# Patient Record
Sex: Female | Born: 1970 | Race: Black or African American | Hispanic: No
Health system: Southern US, Community
[De-identification: ages and names within clinical notes are randomized; demographics above are authoritative.]

## PROBLEM LIST (undated history)

## (undated) DIAGNOSIS — D219 Benign neoplasm of connective and other soft tissue, unspecified: Secondary | ICD-10-CM

---

## 2019-06-30 ENCOUNTER — Other Ambulatory Visit: Payer: Self-pay | Admitting: Obstetrics & Gynecology

## 2019-06-30 DIAGNOSIS — M5416 Radiculopathy, lumbar region: Secondary | ICD-10-CM

## 2019-07-23 ENCOUNTER — Ambulatory Visit
Admission: RE | Admit: 2019-07-23 | Discharge: 2019-07-23 | Disposition: A | Discharge status: Home / Self Care | Payer: BC Managed Care – PPO | Source: Ambulatory Visit | Attending: Obstetrics & Gynecology | Admitting: Obstetrics & Gynecology

## 2019-07-23 ENCOUNTER — Other Ambulatory Visit: Payer: Self-pay

## 2019-07-23 DIAGNOSIS — M5416 Radiculopathy, lumbar region: Secondary | ICD-10-CM

## 2019-07-23 IMAGING — MR MR LUMBAR SPINE W/O CM
4 of 5 series · 24 of 48 positions shown · non-contrast
Comparison: No pertinent prior studies available for comparison.

CLINICAL DATA: Radiculopathy of lumbar region. Additional history
provided: Status post fall [DATE], low back pain and bilateral
leg weakness, mostly on the left, pain extends down both legs.

EXAM:
MRI LUMBAR SPINE WITHOUT CONTRAST
TECHNIQUE: Multiplanar, multisequence MR imaging of the lumbar spine was
performed. No intravenous contrast was administered.

[Series 2: T2 · sagittal · 4.0mm · 0.55mm/px · 6 of 17 slices shown (1 of 2)]
[im 1/17]
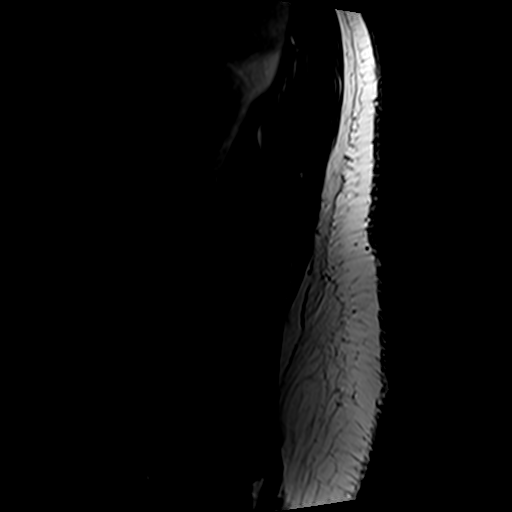
[im 4/17]
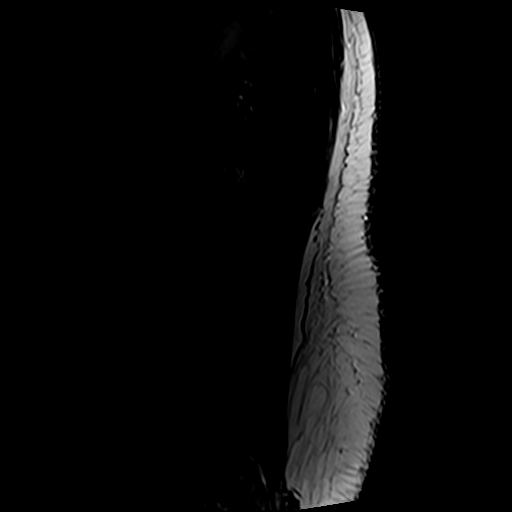
[im 7/17]
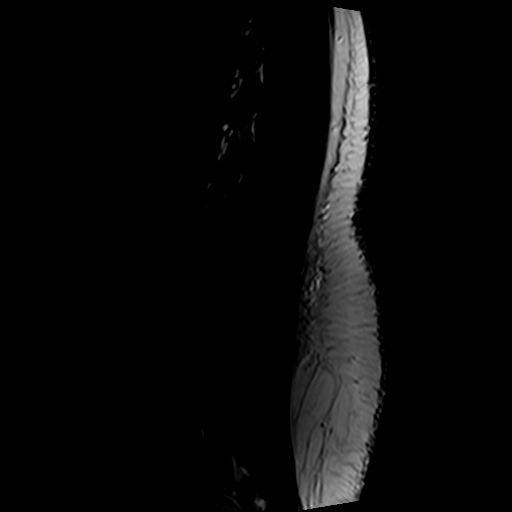
[im 10/17]
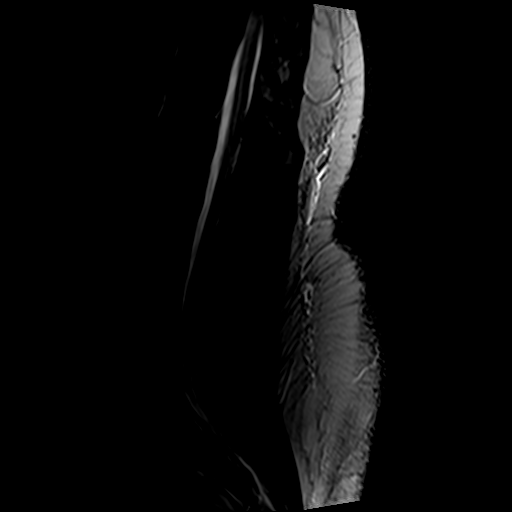
[im 13/17]
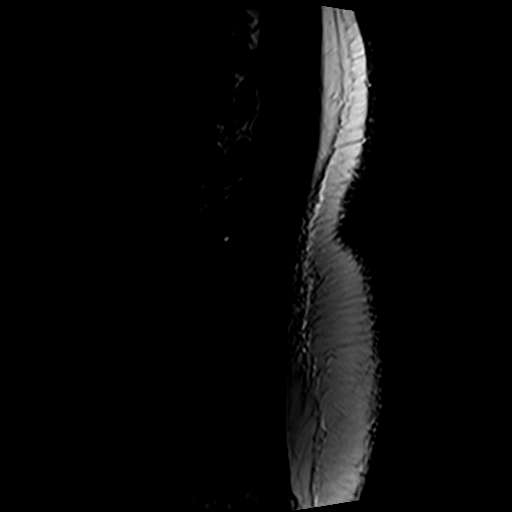
[im 17/17]
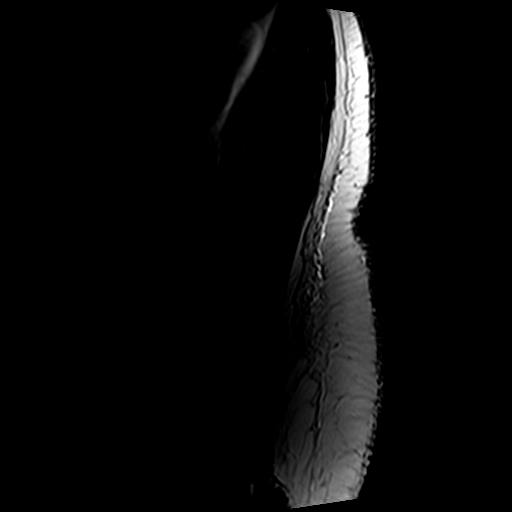

[Series 4: T1 · sagittal · 4.0mm · 0.55mm/px · 6 of 17 slices shown (1 of 2)]
[im 1/17]
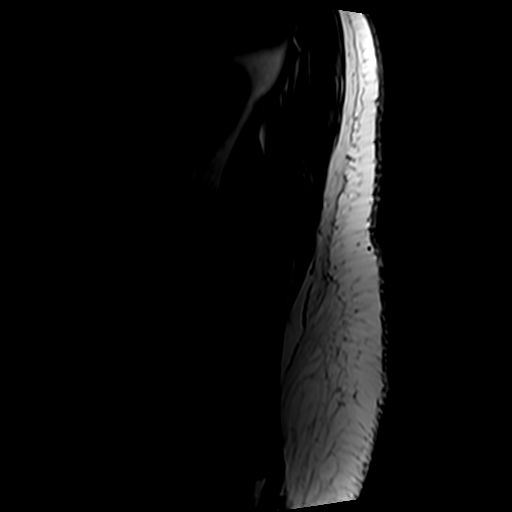
[im 4/17]
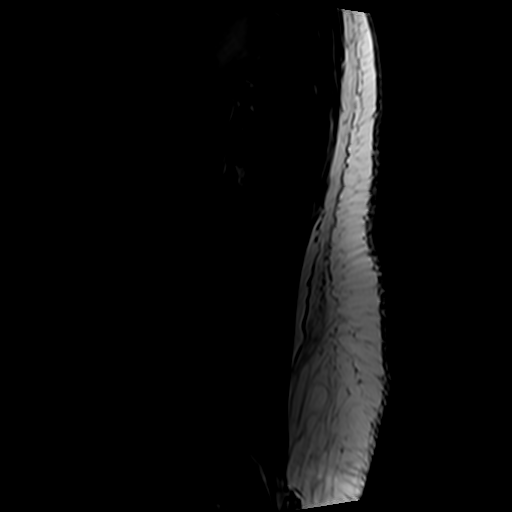
[im 7/17]
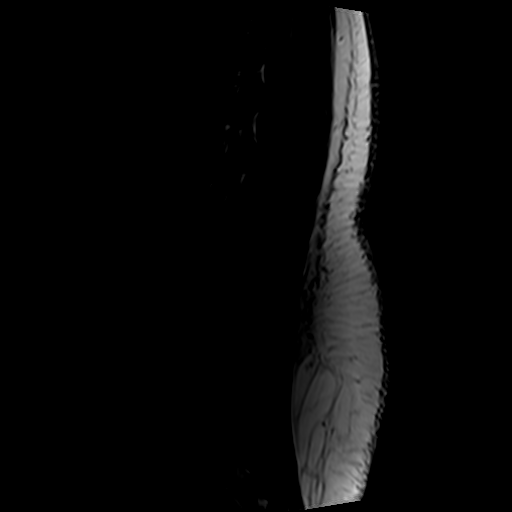
[im 10/17]
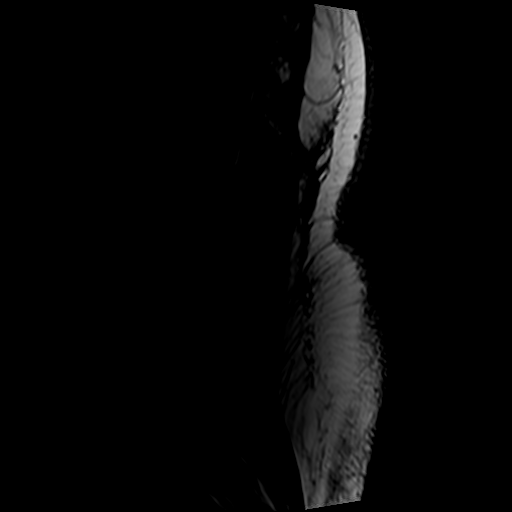
[im 13/17]
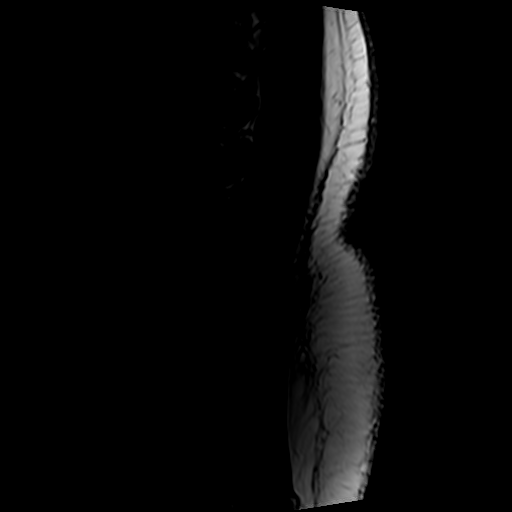
[im 17/17]
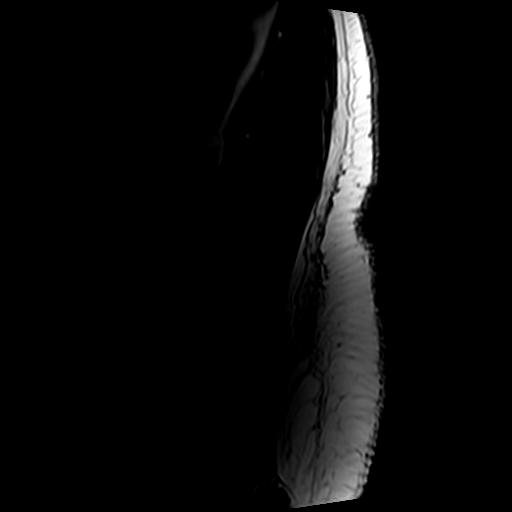

[Series 5: T2 · axial · 4.0mm · 0.70mm/px · z∈[-127,+114]mm · 9 of 45 slices shown (2 of 2)]
[im 1/45]
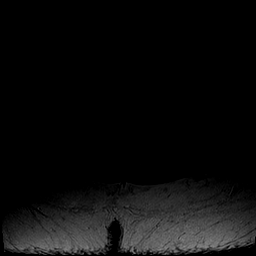
[im 7/45]
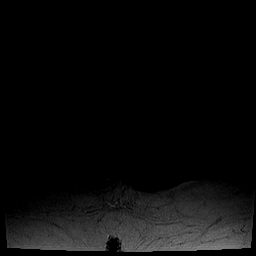
[im 13/45]
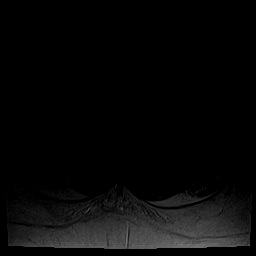
[im 19/45]
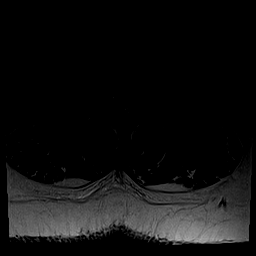
[im 23/45]
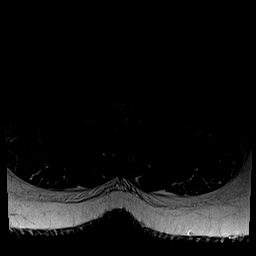
[im 26/45]
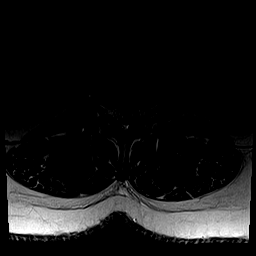
[im 32/45]
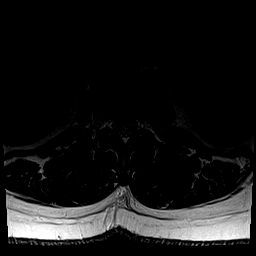
[im 38/45]
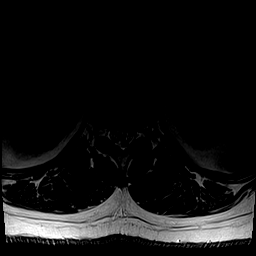
[im 45/45]
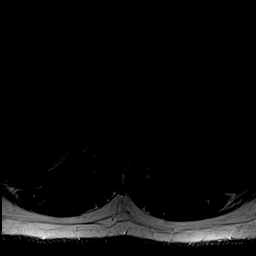

[Series 6: T1 · axial · 4.0mm · 0.35mm/px · z∈[-96,+77]mm · 3 of 45 slices shown (2 of 2)]
[im 7/45]
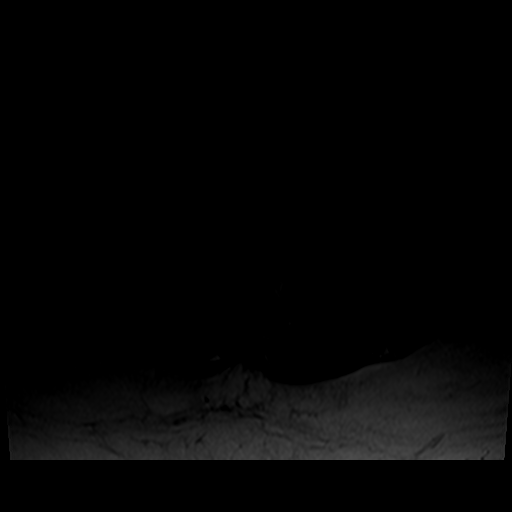
[im 23/45]
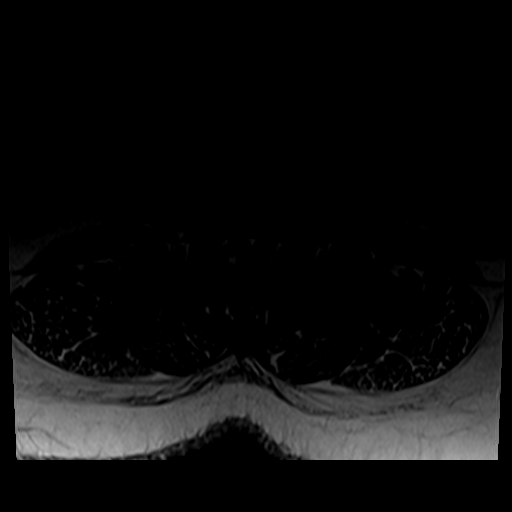
[im 38/45]
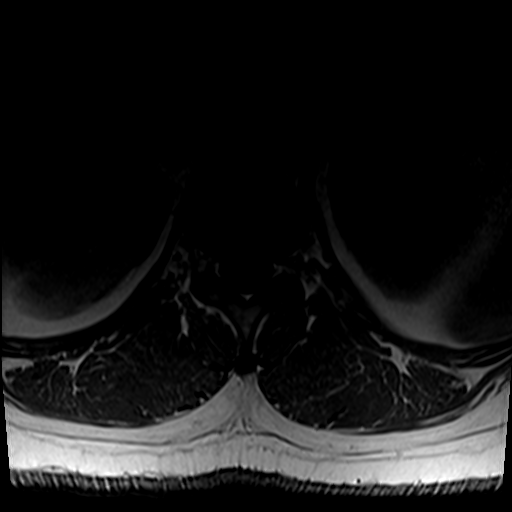

[24 of 48 positions shown; findings below may reference images not displayed]

FINDINGS: Segmentation: For the purposes of this dictation, five lumbar
vertebrae are assumed and the caudal most well-formed intervertebral
disc is designated L5-S1.

Alignment:  No significant spondylolisthesis.

Vertebrae: Vertebral body height is maintained. No marrow edema or
suspicious osseous lesion

Conus medullaris and cauda equina: Conus extends to the L1-L2 level.
No signal abnormality within the visualized distal spinal cord.

Paraspinal and other soft tissues: No abnormality identified within
included portions of the abdomen/retroperitoneum. Paraspinal soft
tissues within normal limits.

Disc levels:

Mild L4-L5 disc degeneration.

T11-T12: Shallow disc bulge. No significant spinal canal or
foraminal stenosis.

T12-L1: No disc herniation. No significant canal or foraminal
stenosis.

L1-L2: No disc herniation. No significant canal or foraminal
stenosis.

L2-L3: Mild facet arthrosis/ligamentum flavum hypertrophy. No disc
herniation. No significant canal or foraminal stenosis.

L3-L4: Disc bulge. Superimposed shallow left subarticular/foraminal
disc protrusion. Mild facet arthrosis/ligamentum flavum hypertrophy.
Mild left greater than right subarticular narrowing without frank
nerve root impingement. Central canal patent. Minimal left neural
foraminal narrowing.

L4-L5: Disc bulge. Superimposed broad-based left center to left
foraminal disc protrusion. Mild facet arthrosis with moderate
ligamentum flavum hypertrophy. Moderate central canal stenosis.
Bilateral subarticular stenosis (severe on the left) with crowding
of left-sided descending nerve roots, particularly the descending
left L5 nerve root. Moderate left neural foraminal narrowing.

L5-S1: Moderate facet arthrosis/ligamentum flavum hypertrophy. Mild
left subarticular narrowing without frank nerve root impingement.
Central canal patent. Mild bilateral neural foraminal narrowing.
IMPRESSION: Lumbar spondylosis as outlined and most notably as follows.

At L4-L5, disc bulge with superimposed broad-based left center to
left foraminal disc protrusion. Facet arthrosis/ligamentum flavum
hypertrophy. Moderate central canal stenosis. Bilateral subarticular
stenosis (severe on the left) with encroachment upon descending
left-sided nerve roots, particularly the descending left L5 nerve
root. Moderate left neural foraminal narrowing.

No more than mild spinal canal or neural foraminal narrowing at the
remaining levels.

## 2019-07-26 ENCOUNTER — Other Ambulatory Visit: Payer: Self-pay | Admitting: Obstetrics and Gynecology

## 2019-07-26 DIAGNOSIS — D259 Leiomyoma of uterus, unspecified: Secondary | ICD-10-CM

## 2019-08-19 ENCOUNTER — Other Ambulatory Visit: Payer: Self-pay | Admitting: Obstetrics and Gynecology

## 2019-08-19 ENCOUNTER — Inpatient Hospital Stay: Admission: RE | Admit: 2019-08-19 | Payer: BC Managed Care – PPO | Source: Ambulatory Visit

## 2019-08-19 DIAGNOSIS — D259 Leiomyoma of uterus, unspecified: Secondary | ICD-10-CM

## 2019-08-28 ENCOUNTER — Emergency Department (HOSPITAL_COMMUNITY): Payer: BC Managed Care – PPO

## 2019-08-28 ENCOUNTER — Other Ambulatory Visit: Payer: Self-pay

## 2019-08-28 ENCOUNTER — Emergency Department (HOSPITAL_COMMUNITY)
Admission: EM | Admit: 2019-08-28 | Discharge: 2019-08-28 | Disposition: A | Discharge status: Home / Self Care | Payer: BC Managed Care – PPO | Attending: Emergency Medicine | Admitting: Emergency Medicine

## 2019-08-28 ENCOUNTER — Encounter (HOSPITAL_COMMUNITY): Payer: Self-pay

## 2019-08-28 DIAGNOSIS — R102 Pelvic and perineal pain: Secondary | ICD-10-CM | POA: Insufficient documentation

## 2019-08-28 DIAGNOSIS — D219 Benign neoplasm of connective and other soft tissue, unspecified: Secondary | ICD-10-CM | POA: Diagnosis not present

## 2019-08-28 DIAGNOSIS — N939 Abnormal uterine and vaginal bleeding, unspecified: Secondary | ICD-10-CM | POA: Diagnosis not present

## 2019-08-28 DIAGNOSIS — D649 Anemia, unspecified: Secondary | ICD-10-CM | POA: Insufficient documentation

## 2019-08-28 HISTORY — DX: Benign neoplasm of connective and other soft tissue, unspecified: D21.9

## 2019-08-28 LAB — CBC WITH DIFFERENTIAL/PLATELET
Abs Immature Granulocytes: 0.02 10*3/uL (ref 0.00–0.07)
Basophils Absolute: 0 10*3/uL (ref 0.0–0.1)
Basophils Relative: 0 %
Eosinophils Absolute: 0 10*3/uL (ref 0.0–0.5)
Eosinophils Relative: 1 %
HCT: 29.7 % — ABNORMAL LOW (ref 36.0–46.0)
Hemoglobin: 8.3 g/dL — ABNORMAL LOW (ref 12.0–15.0)
Immature Granulocytes: 0 %
Lymphocytes Relative: 25 %
Lymphs Abs: 1.3 10*3/uL (ref 0.7–4.0)
MCH: 17.4 pg — ABNORMAL LOW (ref 26.0–34.0)
MCHC: 27.9 g/dL — ABNORMAL LOW (ref 30.0–36.0)
MCV: 62.1 fL — ABNORMAL LOW (ref 80.0–100.0)
Monocytes Absolute: 0.5 10*3/uL (ref 0.1–1.0)
Monocytes Relative: 9 %
Neutro Abs: 3.3 10*3/uL (ref 1.7–7.7)
Neutrophils Relative %: 65 %
Platelets: 311 10*3/uL (ref 150–400)
RBC: 4.78 MIL/uL (ref 3.87–5.11)
RDW: 20 % — ABNORMAL HIGH (ref 11.5–15.5)
WBC: 5.2 10*3/uL (ref 4.0–10.5)
nRBC: 0 % (ref 0.0–0.2)

## 2019-08-28 LAB — URINALYSIS, ROUTINE W REFLEX MICROSCOPIC
Bacteria, UA: NONE SEEN
Bilirubin Urine: NEGATIVE
Glucose, UA: >=500 mg/dL — AB
Ketones, ur: 5 mg/dL — AB
Leukocytes,Ua: NEGATIVE
Nitrite: NEGATIVE
Protein, ur: NEGATIVE mg/dL
RBC / HPF: >50 RBC/hpf — ABNORMAL HIGH (ref 0–5)
Specific Gravity, Urine: 1.031 — ABNORMAL HIGH (ref 1.005–1.030)
pH: 6 (ref 5.0–8.0)

## 2019-08-28 LAB — COMPREHENSIVE METABOLIC PANEL
ALT: 22 U/L (ref 0–44)
AST: 19 U/L (ref 15–41)
Albumin: 3.4 g/dL — ABNORMAL LOW (ref 3.5–5.0)
Alkaline Phosphatase: 75 U/L (ref 38–126)
Anion gap: 5 (ref 5–15)
BUN: 9 mg/dL (ref 6–20)
CO2: 28 mmol/L (ref 22–32)
Calcium: 8.9 mg/dL (ref 8.9–10.3)
Chloride: 108 mmol/L (ref 98–111)
Creatinine, Ser: 0.62 mg/dL (ref 0.44–1.00)
GFR calc Af Amer: >60 mL/min (ref >60)
GFR calc non Af Amer: >60 mL/min (ref >60)
Glucose, Bld: 258 mg/dL — ABNORMAL HIGH (ref 70–99)
Potassium: 4 mmol/L (ref 3.5–5.1)
Sodium: 141 mmol/L (ref 135–145)
Total Bilirubin: 0.5 mg/dL (ref 0.3–1.2)
Total Protein: 6.9 g/dL (ref 6.5–8.1)

## 2019-08-28 LAB — WET PREP, GENITAL
Clue Cells Wet Prep HPF POC: NONE SEEN
Sperm: NONE SEEN
Trich, Wet Prep: NONE SEEN
Yeast Wet Prep HPF POC: NONE SEEN

## 2019-08-28 LAB — PREGNANCY, URINE: Preg Test, Ur: NEGATIVE

## 2019-08-28 IMAGING — US US PELVIS COMPLETE TRANSABD/TRANSVAG W DUPLEX
1 series · 13 of 25 positions shown · non-contrast
Comparison: None available.

CLINICAL DATA: Initial evaluation for acute pelvic pain for 6
months, heavy vaginal bleeding.

EXAM:
TRANSABDOMINAL AND TRANSVAGINAL ULTRASOUND OF PELVIS
DOPPLER ULTRASOUND OF OVARIES
TECHNIQUE: Both transabdominal and transvaginal ultrasound examinations of the
pelvis were performed. Transabdominal technique was performed for
global imaging of the pelvis including uterus, ovaries, adnexal
regions, and pelvic cul-de-sac.
It was necessary to proceed with endovaginal exam following the
transabdominal exam to visualize the uterus, endometrium, and
ovaries. Color and duplex Doppler ultrasound was utilized to
evaluate blood flow to the ovaries.

[Series 1: us pelvis complete transabd/transvag w duplex · 13 of 101 slices shown]
[im 1/101]
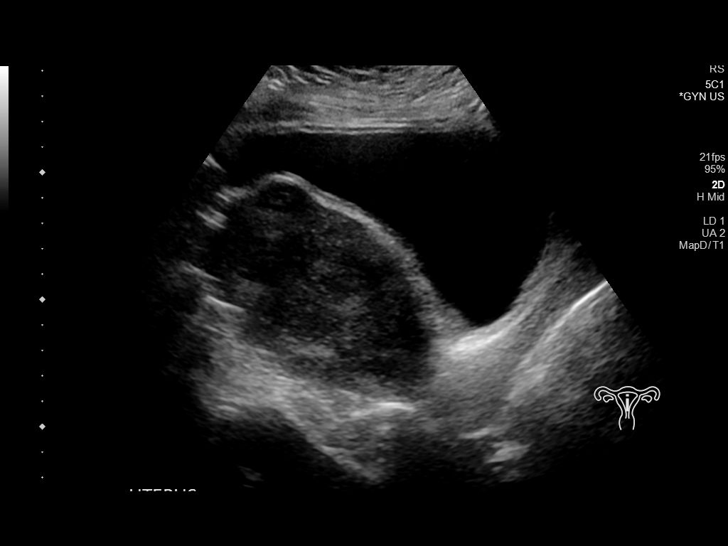
[im 9/101]
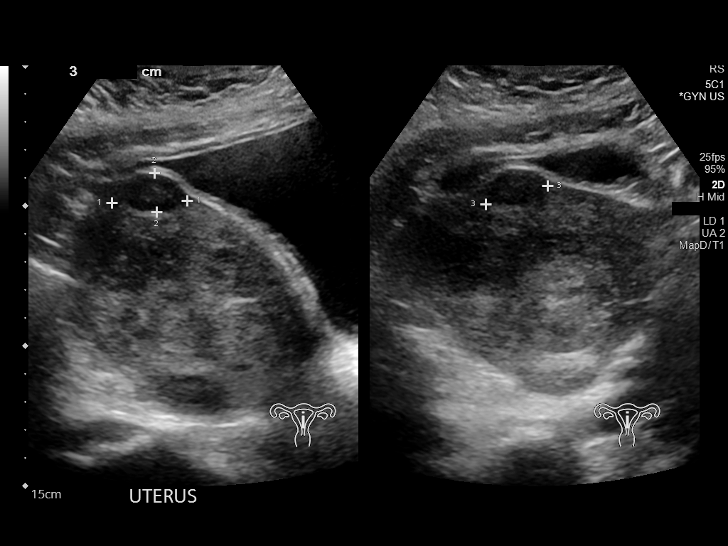
[im 17/101]
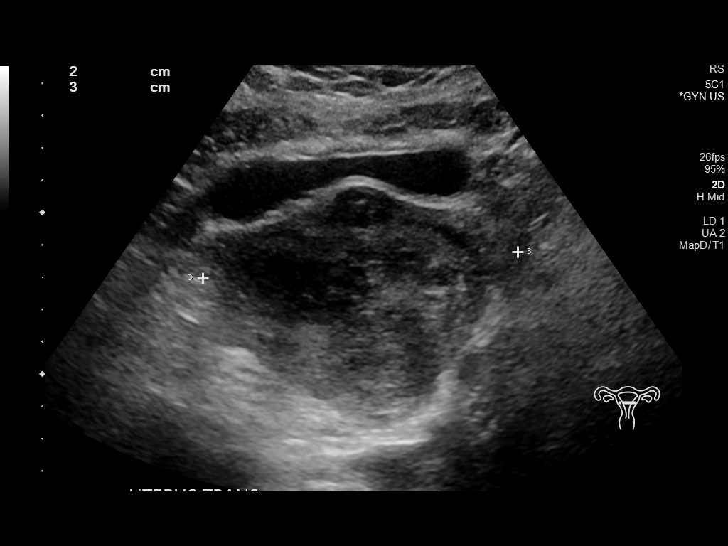
[im 26/101]
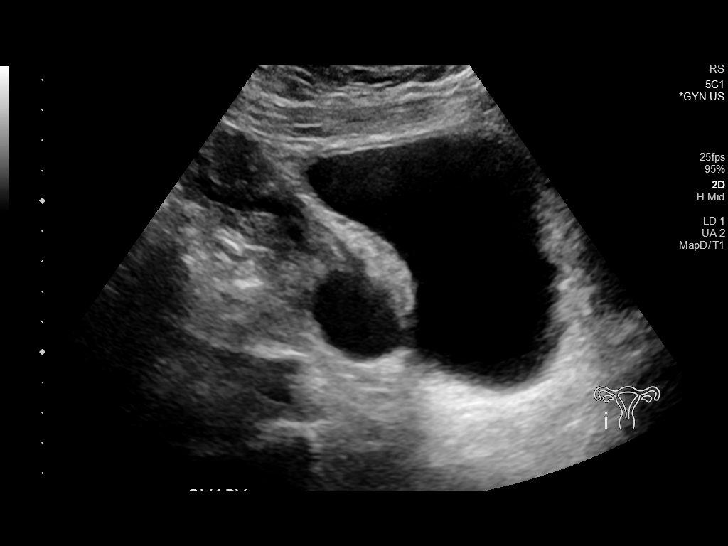
[im 34/101]
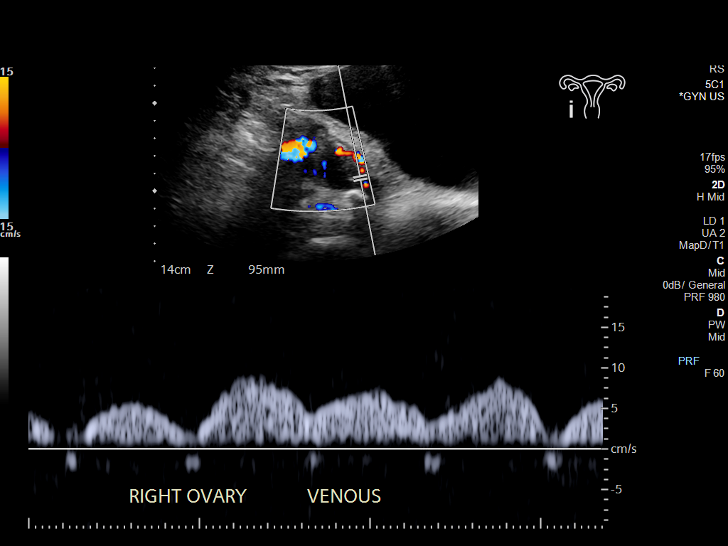
[im 42/101]
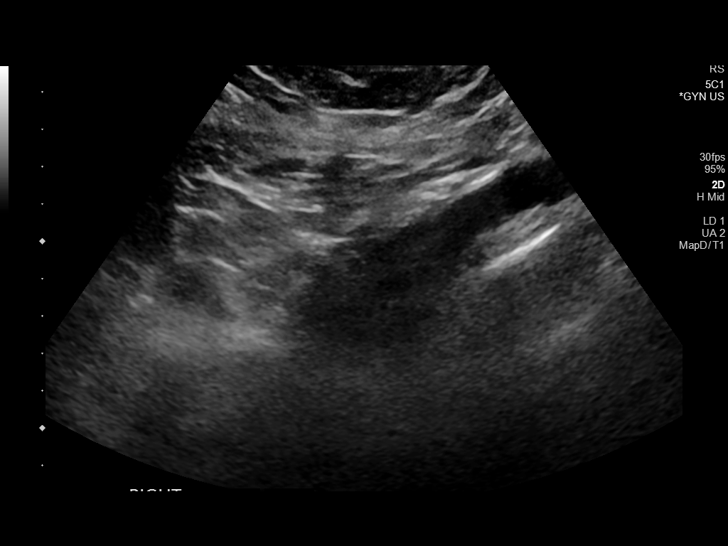
[im 51/101]
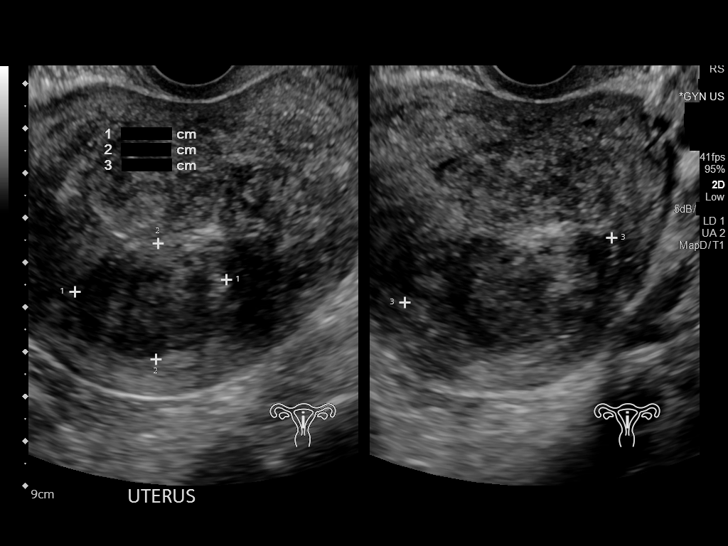
[im 59/101]
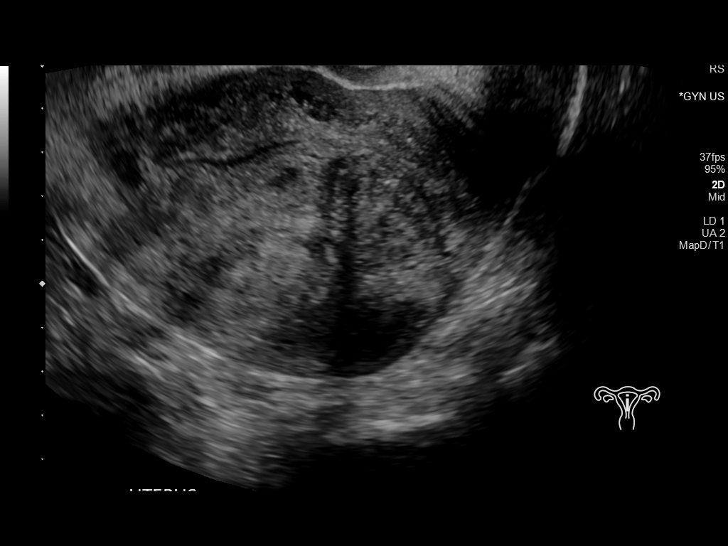
[im 67/101]
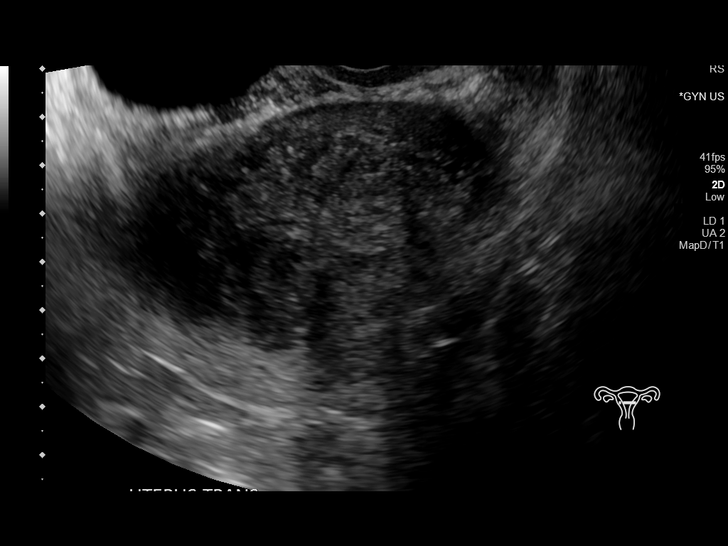
[im 76/101]
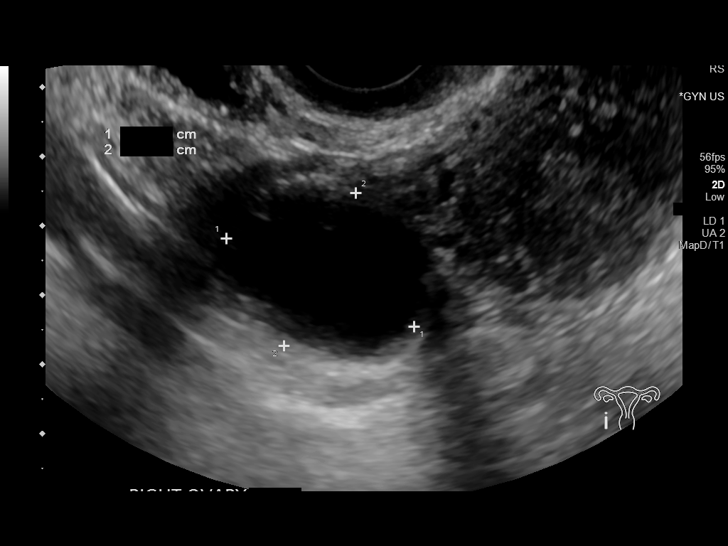
[im 84/101]
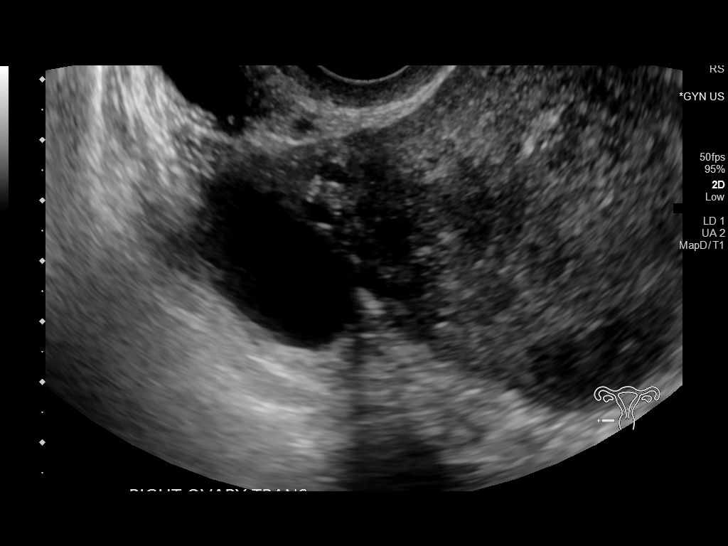
[im 92/101]
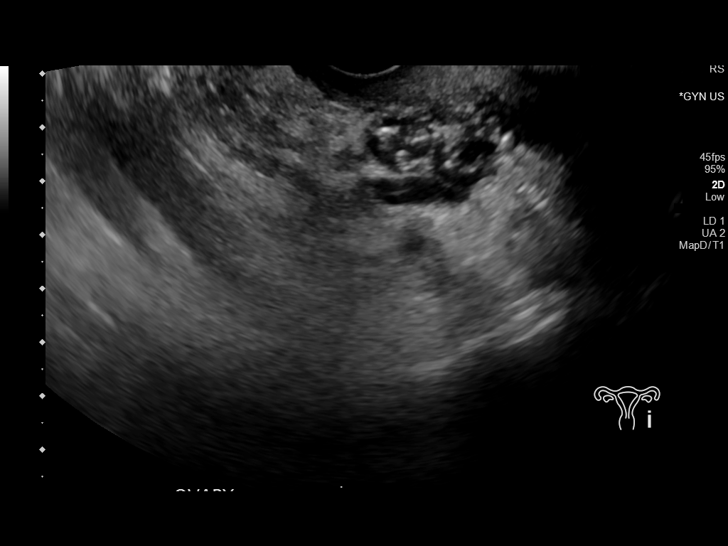
[im 101/101]
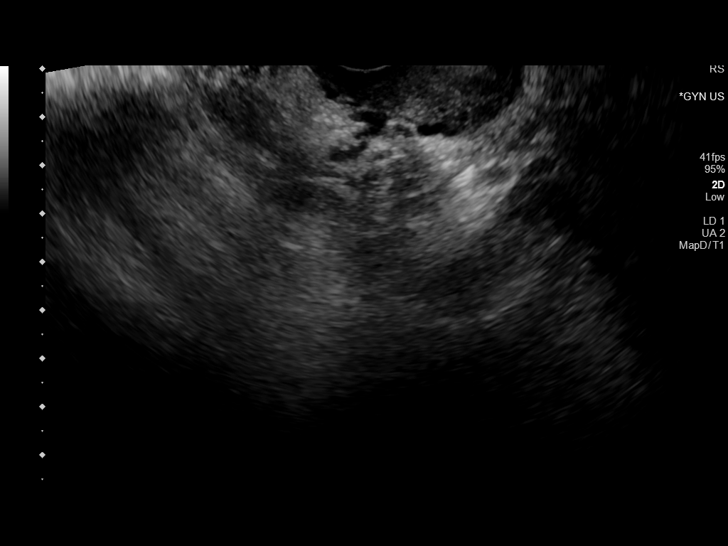

[13 of 25 positions shown; findings below may reference images not displayed]

FINDINGS: Uterus

Measurements: 14.0 x 6.9 x 9.8 cm = volume: 493.3 mL. 2.9 x 3.0 x
3.2 cm intramural fibroid present at the right posterior uterine
fundus. 2.7 x 1.4 x 2.3 cm subserosal fibroid present at the central
uterine fundus. 4.9 x 3.4 x 2.6 cm intramural fibroid present at the
right posterior uterine body/fundus.

Endometrium

Not well visualized due to overlying fibroids.

Right ovary

Measurements: 4.4 x 3.5 x 3.3 cm = volume: 26.1 mL. 3.0 x 2.4 x
cm simple cyst, most consistent with a normal physiologic follicular
cyst.

Left ovary

Not visualized.  No adnexal mass.

Pulsed Doppler evaluation of the right ovary demonstrates normal
low-resistance arterial and venous waveforms.

Other findings

No abnormal free fluid.
IMPRESSION: 1. Enlarged fibroid uterus as above.
2. 3 cm simple right ovarian cyst, most consistent with a normal
physiologic follicular cyst. This has benign characteristics and is
a common finding in premenopausal females. No imaging follow up is
required. No evidence for right ovarian torsion.
3. Nonvisualization of the left ovary.  No left adnexal mass.
4. Poor visualization of the endometrial complex due to overlying
fibroids. If there is clinical concern for an underlying endometrial
pathology, further assessment with dedicated sonohysterography
and/or pelvic MRI could be performed for further evaluation as
warranted.

## 2019-08-28 MED ORDER — SODIUM CHLORIDE 0.9 % IV BOLUS
1000.0000 mL | Freq: Once | INTRAVENOUS | Status: AC
  Administered 2019-08-28: 1000 mL via INTRAVENOUS

## 2019-08-28 NOTE — ED Provider Notes (Signed)
[facility] DEPT Provider Note   CSN: [id number] Arrival date & time: 08/28/19  1328    History Chief Complaint  Patient presents with  . Vaginal Bleeding    Melinda Wade is a 49 y.o. female with a history of fibroids, prior endometrial ablation, and T2DM who presents to the ED with complaints of vaginal bleeding x 6 days. Patient states she has a longstanding history of irregular vaginal bleeding related to her fibroids. She last stopped bleeding 05/30/19 (after about 3 months of bleeding) then subsequently restarted this past Tuesday. She states bleeding has been intermittently heavy, she became concerned when she started passing large clots, clot passage has never been this large before. Reports intermittent pelvic cramping which she has had previously with bleeding. She has had some a few episodes of intermittent lightheadedness/dizziness with quick position changes, otherwise none, no sycnope, as well as sensation of elevated HR. Given she was soaking through a pad within 1.5 hours she came to the ED per discussion with her OBGYN. She denies syncope, chest pain, dyspnea, fever, chills, or dysuria.   She is currently followed by GYN Dr. Jerl Santos with [facility]- plan for pelvic MRI 04/14 for further evaluation of fibroids and referral to interventional radiology @ last office visit 07/14/19. She would like minimally invasive surgical intervention, trying to avoid hysterectomy. Prior endometrial ablation helped with her sxs. She has required blood transfusions in the past. Currently has IUD in place.   HPI     Past Medical History:  Diagnosis Date  . Fibroids     There are no problems to display for this patient.   History reviewed. No pertinent surgical history.   OB History   No obstetric history on file.     No family history on file.  Social History   Tobacco Use  . Smoking status: Not on file  Substance Use Topics  . Alcohol use:  Not on file  . Drug use: Not on file    Home Medications Prior to Admission medications   Not on File    Allergies    Patient has no known allergies.  Review of Systems   Review of Systems  Constitutional: Negative for chills and fever.  Respiratory: Negative for shortness of breath.   Cardiovascular: Positive for palpitations. Negative for chest pain.  Gastrointestinal: Negative for diarrhea, nausea and vomiting.  Genitourinary: Positive for pelvic pain and vaginal bleeding. Negative for dysuria and vaginal discharge.  Neurological: Positive for dizziness and light-headedness. Negative for syncope.  All other systems reviewed and are negative.   Physical Exam Updated Vital Signs BP (!) 156/74 (BP Location: Right Arm)   Pulse 94   Temp 98 F (36.7 C) (Oral)   Resp 17   LMP  (LMP Unknown)   SpO2 92%   Physical Exam Vitals and nursing note reviewed. Exam conducted with a chaperone present.  Constitutional:      General: She is not in acute distress.    Appearance: She is well-developed. She is not toxic-appearing.  HENT:     Head: Normocephalic and atraumatic.  Eyes:     General:        Right eye: No discharge.        Left eye: No discharge.     Comments: Mild conjunctival pallor noted.   Cardiovascular:     Rate and Rhythm: Normal rate and regular rhythm.  Pulmonary:     Effort: Pulmonary effort is normal. No respiratory distress.  Breath sounds: Normal breath sounds. No wheezing, rhonchi or rales.  Abdominal:     General: There is no distension.     Palpations: Abdomen is soft.     Tenderness: There is abdominal tenderness (suprapubic). There is no guarding or rebound.  Genitourinary:    Vagina: Bleeding present.     Cervix: Cervical bleeding present.     Comments: Diffuse discomfort throughout exam.  No significant discharge noted. Mild blood present, no significant clots present.  Musculoskeletal:     Cervical back: Neck supple.  Skin:    General:  Skin is warm and dry.     Findings: No rash.  Neurological:     Mental Status: She is alert.     Comments: Clear speech.   Psychiatric:        Behavior: Behavior normal.     ED Results / Procedures / Treatments   Labs (all labs ordered are listed, but only abnormal results are displayed) Labs Reviewed  WET PREP, GENITAL - Abnormal; Notable for the following components:      Result Value   WBC, Wet Prep HPF POC FEW (*)    All other components within normal limits  COMPREHENSIVE METABOLIC PANEL - Abnormal; Notable for the following components:   Glucose, Bld 258 (*)    Albumin 3.4 (*)    All other components within normal limits  CBC WITH DIFFERENTIAL/PLATELET - Abnormal; Notable for the following components:   Hemoglobin 8.3 (*)    HCT 29.7 (*)    MCV 62.1 (*)    MCH 17.4 (*)    MCHC 27.9 (*)    RDW 20.0 (*)    All other components within normal limits  URINALYSIS, ROUTINE W REFLEX MICROSCOPIC - Abnormal; Notable for the following components:   Specific Gravity, Urine 1.031 (*)    Glucose, UA >=500 (*)    Hgb urine dipstick LARGE (*)    Ketones, ur 5 (*)    RBC / HPF >50 (*)    All other components within normal limits  PREGNANCY, URINE  GC/CHLAMYDIA PROBE AMP ([place]) NOT AT [facility]    EKG EKG Interpretation  Date/Time:  Sunday August 28 2019 16:40:03 EDT Ventricular Rate:  86 PR Interval:    QRS Duration: 105 QT Interval:  381 QTC Calculation: 456 R Axis:   39 Text Interpretation: Sinus rhythm Low voltage, precordial leads Abnormal R-wave progression, early transition No previous ECGs available Confirmed by Wandra Arthurs [phone]) on 08/28/2019 4:55:40 PM   Radiology US PELVIC COMPLETE W TRANSVAGINAL AND TORSION R/O  Result Date: 08/28/2019 CLINICAL DATA:  Initial evaluation for acute pelvic pain for 6 months, heavy vaginal bleeding. EXAM: TRANSABDOMINAL AND TRANSVAGINAL ULTRASOUND OF PELVIS DOPPLER ULTRASOUND OF OVARIES TECHNIQUE: Both transabdominal and  transvaginal ultrasound examinations of the pelvis were performed. Transabdominal technique was performed for global imaging of the pelvis including uterus, ovaries, adnexal regions, and pelvic cul-de-sac. It was necessary to proceed with endovaginal exam following the transabdominal exam to visualize the uterus, endometrium, and ovaries. Color and duplex Doppler ultrasound was utilized to evaluate blood flow to the ovaries. COMPARISON:  None available. FINDINGS: Uterus Measurements: 14.0 x 6.9 x 9.8 cm = volume: 493.3 mL. 2.9 x 3.0 x 3.2 cm intramural fibroid present at the right posterior uterine fundus. 2.7 x 1.4 x 2.3 cm subserosal fibroid present at the central uterine fundus. 4.9 x 3.4 x 2.6 cm intramural fibroid present at the right posterior uterine body/fundus. Endometrium Not well visualized due to  overlying fibroids. Right ovary Measurements: 4.4 x 3.5 x 3.3 cm = volume: 26.1 mL. 3.0 x 2.4 x 3.0 cm simple cyst, most consistent with a normal physiologic follicular cyst. Left ovary Not visualized.  No adnexal mass. Pulsed Doppler evaluation of the right ovary demonstrates normal low-resistance arterial and venous waveforms. Other findings No abnormal free fluid. IMPRESSION: 1. Enlarged fibroid uterus as above. 2. 3 cm simple right ovarian cyst, most consistent with a normal physiologic follicular cyst. This has benign characteristics and is a common finding in premenopausal females. No imaging follow up is required. No evidence for right ovarian torsion. 3. Nonvisualization of the left ovary.  No left adnexal mass. 4. Poor visualization of the endometrial complex due to overlying fibroids. If there is clinical concern for an underlying endometrial pathology, further assessment with dedicated sonohysterography and/or pelvic MRI could be performed for further evaluation as warranted. Electronically Signed   By: Jeannine Boga M.D.   On: 08/28/2019 18:46    Procedures Procedures (including critical  care time)  Medications Ordered in ED Medications - No data to display  ED Course  I have reviewed the triage vital signs and the nursing notes.  Pertinent labs & imaging results that were available during my care of the patient were reviewed by me and considered in my medical decision making (see chart for details).    MDM Rules/Calculators/A&P                      Patient presents to the ED with complaints of vaginal bleeding. Hx of irregular heavy bleeding- followed by GYN. She is nontoxic, resting comfortably, vitals without significant abnormality, BP elevated- doubt HTN emergency. Exam with some mild suprapubic tenderness and diffuse discomfort on bimanual.  She does have some vaginal bleeding present on speculum exam, no significant clots or hemorrhaging noted.   Additional history obtained:  Additional history obtained from previous reviewed records.  EKG: No STEMI  Lab Tests:  I Ordered, reviewed, and interpreted labs, which included:  CBC: Anemia with hemoglobin of 8.3 and hematocrit of 29.7.  No leukocytosis. CMP: Hyperglycemia without acidosis or anion gap elevation.  No significant electrolyte derangement Urinalysis: Consistent with vaginal bleeding Pregnancy test: Negative Imaging Studies ordered:   I ordered imaging studies which included [place] per patient request which I felt was reasonable, I independently visualized and interpreted imaging which showed enlarged fibroid uterus, simple right ovarian cyst consistent with physiologic process, no evidence of torsion.  Nonvisualization of the left ovary, however no left adnexal masses.  Poor visualization of the endometrial complex due to overlying fibroids.  Medicines ordered:  I ordered medication fluids.  Patient declined analgesics.  Reevaluation: 19:20: After the interventions stated above, I reevaluated the patient and found her to be resting comfortably, abdomen remains without peritoneal signs.  No concern for  STDs by patient, no discharge on exam, do not suspect PID.  Pregnancy test negative therefore doubt ectopic.  Given diffuse tenderness do not suspect torsion.  Overall suspect her presentation is secondary to her fibroids, she is scheduled for a pelvic MRI with and without contrast 08/31/2019, do not feel this needs to be obtained in the emergency department setting.  She does have anemia, hemoglobin and hematocrit are somewhat decreased from prior 9.7 and 34.8 respectively.  She states that she eats an iron rich diet and is unable to tolerate iron supplementation secondary to issues with constipation.  States that she is typically anemic.  Discussed findings and  plan of care with supervising physician Dr. Darl Householder in agreement with plan for discharge home with close GYN follow up and strict return precautions as well.  Vitals remained stable in the ER.  I discussed results, treatment plan, need for follow-up, and return precautions with the patient. Provided opportunity for questions, patient confirmed understanding and is in agreement with plan.   Vitals:   08/28/19 1703 08/28/19 1830  BP: (!) 149/88 (!) 141/77  Pulse: 90 85  Resp: 16 18  Temp:    SpO2: 98% 98%    Portions of this note were generated with [employer]. Dictation errors may occur despite best attempts at proofreading.  Final Clinical Impression(s) / ED Diagnoses Final diagnoses:  Vaginal bleeding  Fibroids  Anemia, unspecified type    Rx / DC Orders ED Discharge Orders    None       Amaryllis Dyke, PA-C 08/28/19 1927    Drenda Freeze, MD 09/01/19 [phone]

## 2019-08-28 NOTE — ED Notes (Signed)
PT provided bedside commode for urination upon request.

## 2019-08-28 NOTE — ED Notes (Signed)
An After Visit Summary was printed and given to the patient. Discharge instructions given and no further questions at this time.  

## 2019-08-28 NOTE — Discharge Instructions (Addendum)
You were seen in the ER today for vaginal bleeding.  Your ultrasound showed findings of your fibroids and a right-sided ovarian cyst.  There was some limited evaluation of your endometrium, however this will be better visualized on your scheduled MRI.  Your hemoglobin was 8.3 today, please continue to eat an iron rich diet. Your blood sugar was somewhat elevated in the 200 range, please be sure to monitor this at home.  We would like you to call your GYN office first thing tomorrow morning to make them aware of your visit today and to establish close follow-up and for recheck of your hemoglobin.  Return to the ER for new or worsening symptoms including but not limited to increased bleeding, increased pain, increased lightheadedness/dizziness, passing out, chest pain, trouble breathing, or any other concerns.

## 2019-08-28 NOTE — ED Triage Notes (Signed)
Pt presents with c/o vaginal bleeding. Pt reports she has a hx of fibroids and was told that if she ever saturates more than one pad/hr that she needs to come for evaluation. Pt reports she has been passing clots today. Pt reports pain in her abdomen, 6/10.

## 2019-08-29 LAB — GC/CHLAMYDIA PROBE AMP (~~LOC~~) NOT AT ARMC
Chlamydia: NEGATIVE
Comment: NEGATIVE
Comment: NORMAL
Neisseria Gonorrhea: NEGATIVE

## 2019-08-31 ENCOUNTER — Other Ambulatory Visit: Payer: Self-pay

## 2019-08-31 ENCOUNTER — Ambulatory Visit
Admission: RE | Admit: 2019-08-31 | Discharge: 2019-08-31 | Disposition: A | Discharge status: Home / Self Care | Payer: BC Managed Care – PPO | Source: Ambulatory Visit | Attending: Obstetrics and Gynecology | Admitting: Obstetrics and Gynecology

## 2019-08-31 ENCOUNTER — Other Ambulatory Visit: Payer: Self-pay | Admitting: Obstetrics and Gynecology

## 2019-08-31 DIAGNOSIS — D259 Leiomyoma of uterus, unspecified: Secondary | ICD-10-CM

## 2019-09-16 ENCOUNTER — Other Ambulatory Visit: Payer: BC Managed Care – PPO

## 2019-10-01 ENCOUNTER — Ambulatory Visit
Admission: RE | Admit: 2019-10-01 | Discharge: 2019-10-01 | Disposition: A | Discharge status: Home / Self Care | Payer: BC Managed Care – PPO | Source: Ambulatory Visit | Attending: Obstetrics and Gynecology | Admitting: Obstetrics and Gynecology

## 2019-10-01 ENCOUNTER — Other Ambulatory Visit: Payer: Self-pay

## 2019-10-01 DIAGNOSIS — D259 Leiomyoma of uterus, unspecified: Secondary | ICD-10-CM

## 2019-10-01 IMAGING — MR MR PELVIS WO/W CM
5 of 12 series · 20 of 48 positions shown · IV contrast (multihance)
Comparison: Pelvic sonogram [DATE]

CLINICAL DATA: Prolonged menstrual bleeding.

EXAM:
MRI PELVIS WITHOUT AND WITH CONTRAST
TECHNIQUE: Multiplanar multisequence MR imaging of the pelvis was performed
both before and after administration of intravenous contrast.
CONTRAST:  20mL MULTIHANCE GADOBENATE DIMEGLUMINE 529 MG/ML IV SOLN

[Series 3: T2 · coronal · 5.0mm · 0.78mm/px · 4 of 26 slices shown]
[im 1/26]
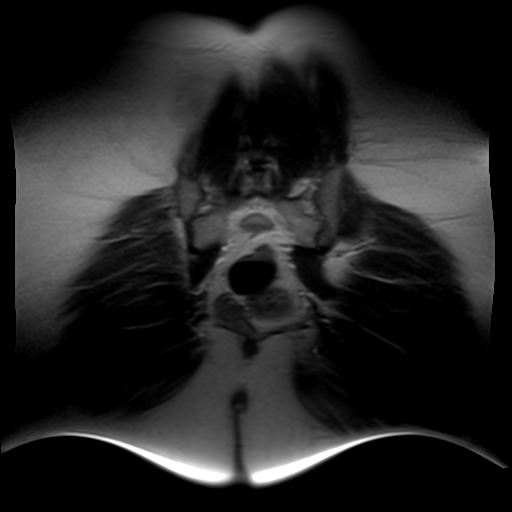
[im 9/26]
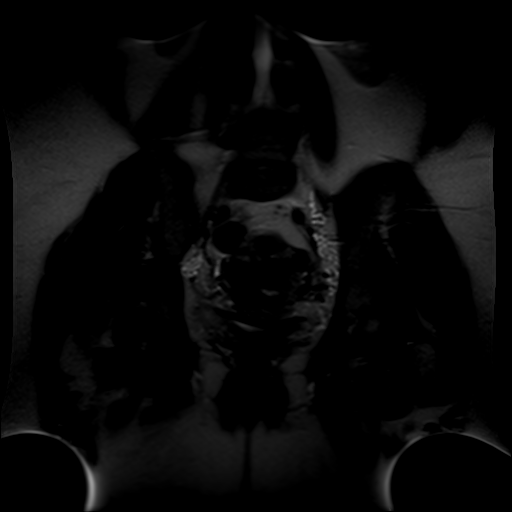
[im 17/26]
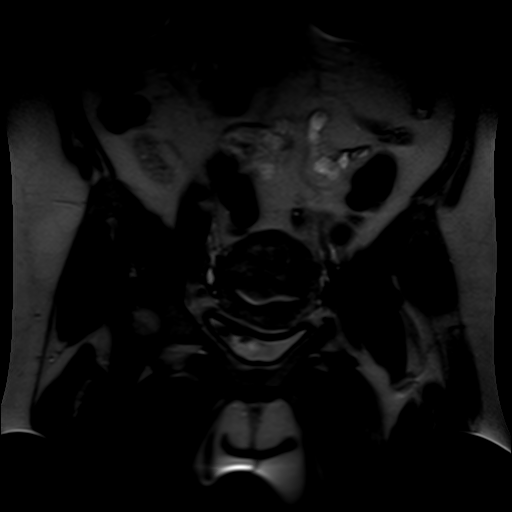
[im 26/26]
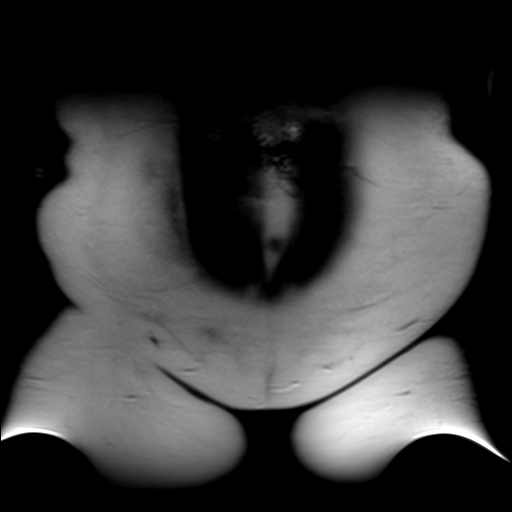

[Series 4: t2_tse_sag · sagittal · 5.0mm · 0.65mm/px · 5 of 33 slices shown]
[im 1/33]
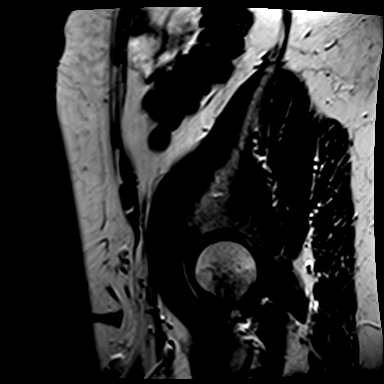
[im 9/33]
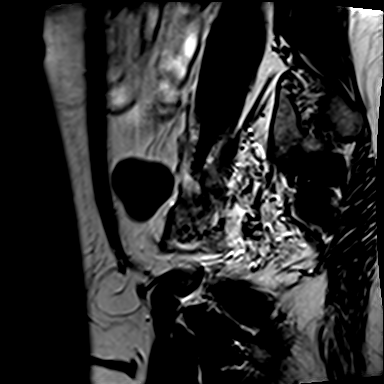
[im 17/33]
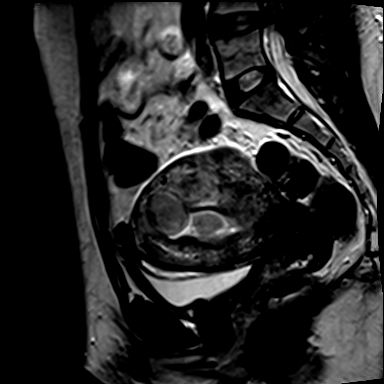
[im 25/33]
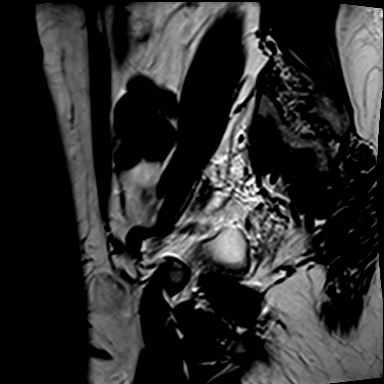
[im 33/33]
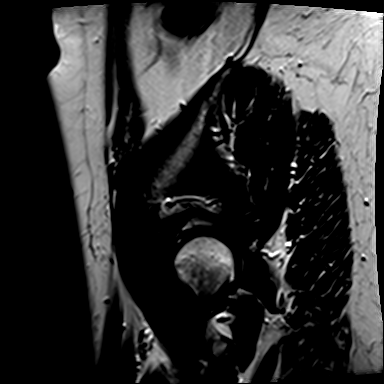

[Series 5: t2_tse axial · axial · 5.0mm · 0.51mm/px · z∈[-83,+92]mm · 4 of 29 slices shown]
[im 1/29]
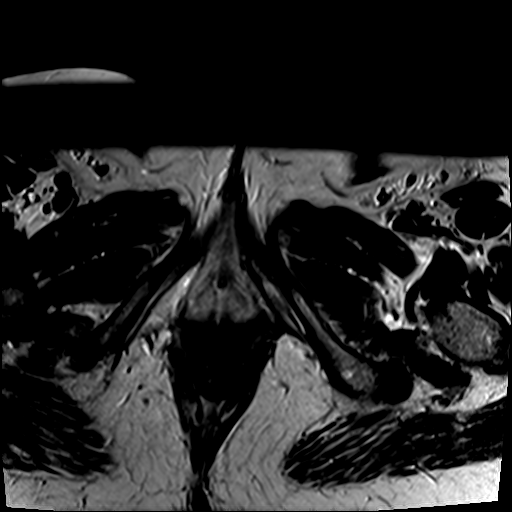
[im 10/29]
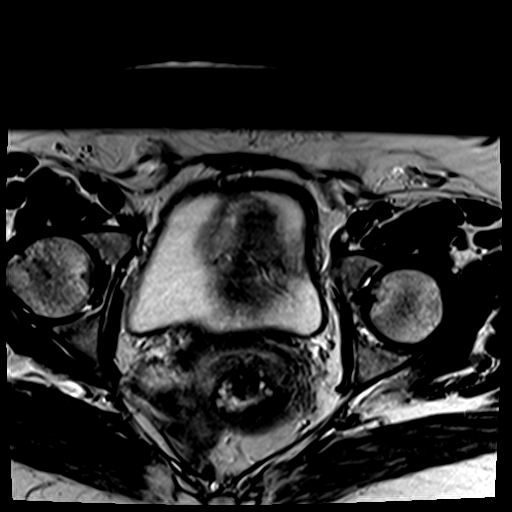
[im 19/29]
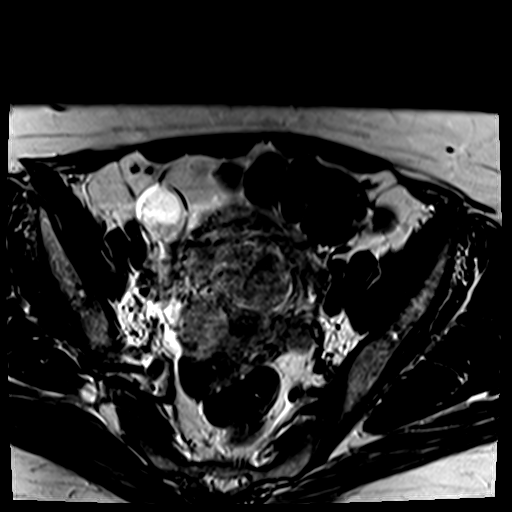
[im 29/29]
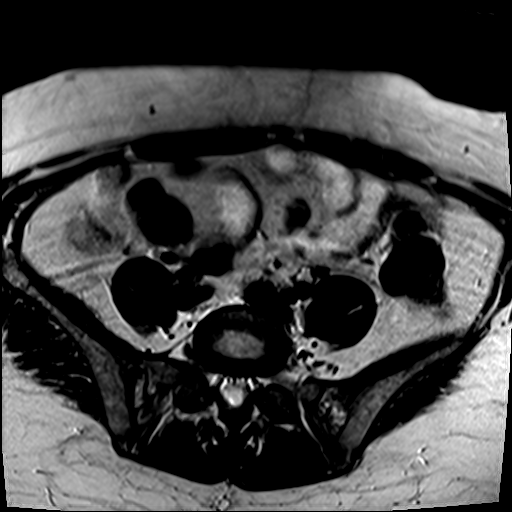

[Series 6: t2_tse axial fs · axial · 5.0mm · 0.51mm/px · z∈[-83,+92]mm · 4 of 29 slices shown]
[im 1/29]
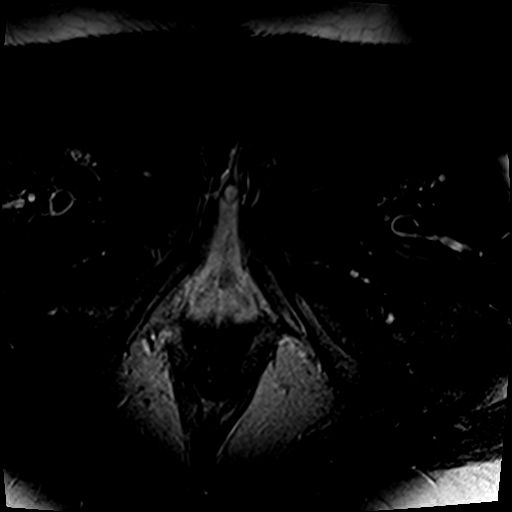
[im 10/29]
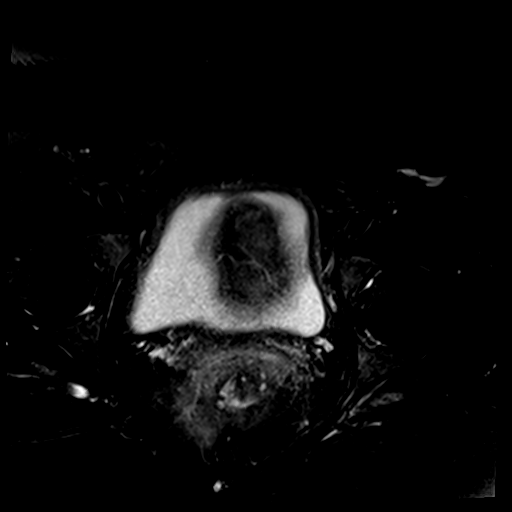
[im 19/29]
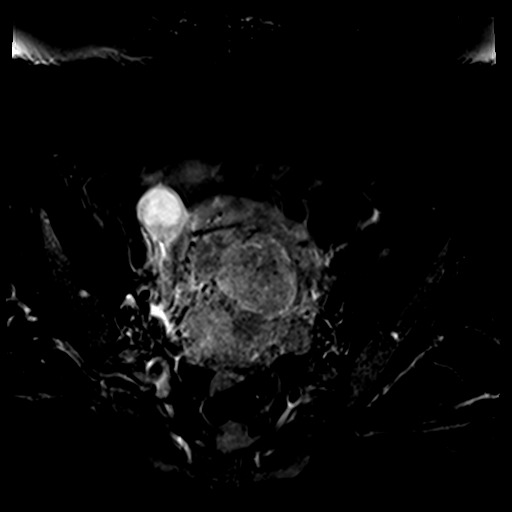
[im 29/29]
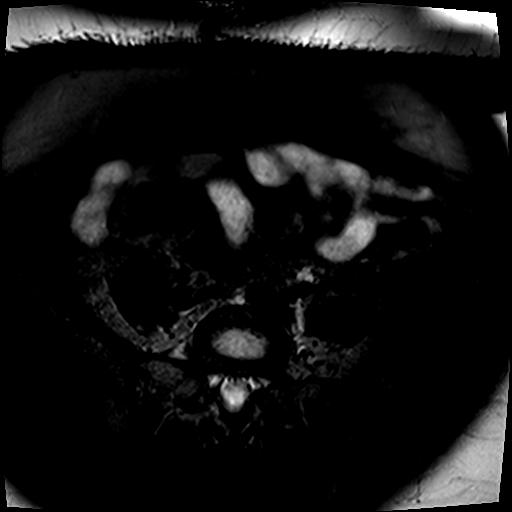

[Series 7: axial spgr · axial · 5.0mm · 0.51mm/px · z∈[-83,+29]mm · 3 of 29 slices shown]
[im 1/29]
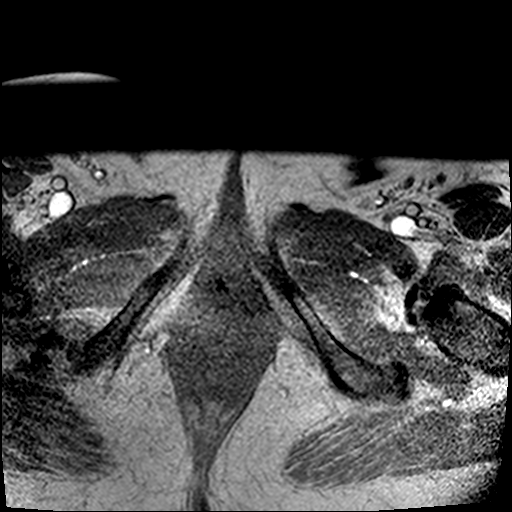
[im 10/29]
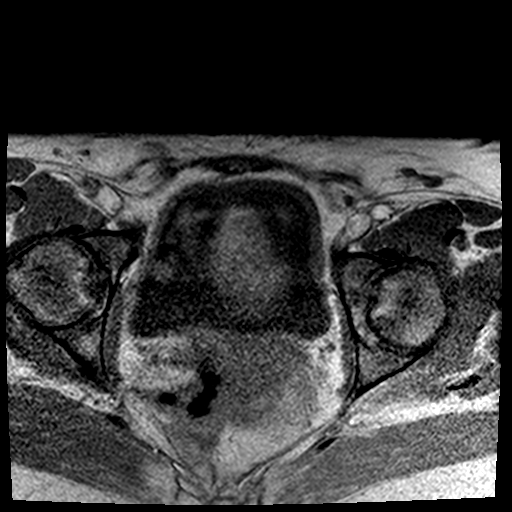
[im 19/29]
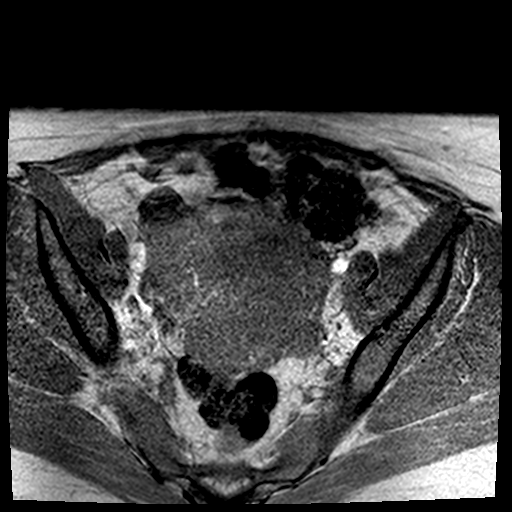

[20 of 48 positions shown; findings below may reference images not displayed]

FINDINGS: Urinary Tract: Normal appearance of the urinary bladder though under
distended. No distal ureteral dilation.

Bowel: Visualized gastrointestinal tract with limited assessment is
unremarkable to the extent evaluated.

Vascular/Lymphatic: Vascular structures in the pelvis are patent
without signs of adenopathy.

Reproductive: Enlarged uterus with numerous leiomyomata. Uterus
measures 12.3 x 8.4 x 9.0 cm.

Numerous leiomyomata with a moderate-size intracavitary leiomyoma
measuring 2.4 x 2.9 x 2.2 cm (image 18, series 4)

Partially submucosal leiomyoma in the mid uterus measuring 3.7 x
x 3.1 cm.

Partially submucosal leiomyoma in the uterine fundus measuring 3.2 x
3.5 x 2.7 cm. These leiomyomata display variable degrees of
submucosal involvement with the first leiomyoma above centered
within the endometrium.

Low signal leiomyoma in the lower uterine segment also partially
submucosal measuring approximately 2.3 x 2.5 cm. This area shows
less enhancement than surrounding leiomyomata. Also is not as well
defined, this may represent adenomyosis, focal adenomyoma, less
likely neoplasm best seen in coronal on post-contrast images, image
10 of series 13 in the leftward lower uterus.

Low signal area the RIGHT lower uterus mainly intramural but
extending to subserosal component along the RIGHT uterine border
(image 13, series [DATE] x 3.1 x 2.6 cm

Smaller partially subserosal leiomyoma near the uterine fundus with
at least 2 additional leiomyomata present in the uterus. Endometrium
grossly normal aside from involvement with leiomyomata, assessment
limited due to distortion.

Leiomyomata display enhancement similar to that of surrounding
myometrium with the exception of the area in the RIGHT lower uterine
fundus that showed lower signal at baseline.

Decreased size of RIGHT ovarian cyst now 2.2 x 2.3 cm. Ovaries are
otherwise unremarkable.

Other:  Small fat containing bilateral inguinal hernias.

Musculoskeletal: No acute bone finding.
IMPRESSION: 1. Numerous leiomyomata with variable degrees of submucosal
involvement, the largest of which is in the mid uterus measuring up
to 2.9 cm. There is a completely intracavitary leiomyoma as
described.
2. Low signal intensity area, less well-defined in the lower uterine
segment and LEFT lower uterine segment may represent adenomyoma
based on its appearance and proximity to junctional zone.
Endometrial neoplasm is felt less likely. Endometrial sampling may
however be helpful as well as close interval follow-up depending on
treatment plan for leiomyomata.
3. Decreased size of RIGHT ovarian cyst now measuring 2.2 x 2.3 cm.
4. No adnexal masses.
5. Small fat containing bilateral inguinal hernias.

## 2019-10-01 MED ORDER — GADOBENATE DIMEGLUMINE 529 MG/ML IV SOLN
20.0000 mL | Freq: Once | INTRAVENOUS | Status: AC | PRN
  Administered 2019-10-01: 20 mL via INTRAVENOUS

## 2020-10-03 ENCOUNTER — Other Ambulatory Visit: Payer: Self-pay | Admitting: Obstetrics & Gynecology

## 2020-10-03 DIAGNOSIS — M5416 Radiculopathy, lumbar region: Secondary | ICD-10-CM

## 2020-10-20 ENCOUNTER — Other Ambulatory Visit: Payer: Self-pay

## 2020-10-20 ENCOUNTER — Ambulatory Visit
Admission: RE | Admit: 2020-10-20 | Discharge: 2020-10-20 | Disposition: A | Discharge status: Home / Self Care | Payer: BC Managed Care – PPO | Source: Ambulatory Visit | Attending: Obstetrics & Gynecology | Admitting: Obstetrics & Gynecology

## 2020-10-20 DIAGNOSIS — M5416 Radiculopathy, lumbar region: Secondary | ICD-10-CM

## 2020-10-20 IMAGING — MR MR LUMBAR SPINE W/O CM
4 of 5 series · 26 of 48 positions shown · non-contrast
Comparison: MRI of the lumbar spine [DATE].

CLINICAL DATA: Low back pain with left hip and lower extremity
pain. Ankle and foot numbness. Symptoms following a fall.

EXAM:
MRI LUMBAR SPINE WITHOUT CONTRAST
TECHNIQUE: Multiplanar, multisequence MR imaging of the lumbar spine was
performed. No intravenous contrast was administered.

[Series 3: T2 · sagittal · 4.0mm · 0.53mm/px · 6 of 15 slices shown (1 of 2)]
[im 1/15]
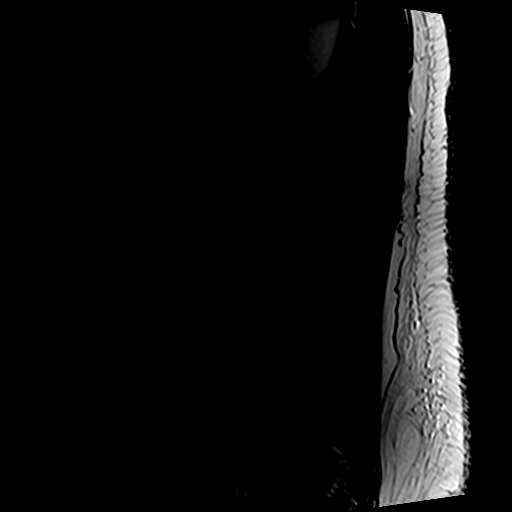
[im 3/15]
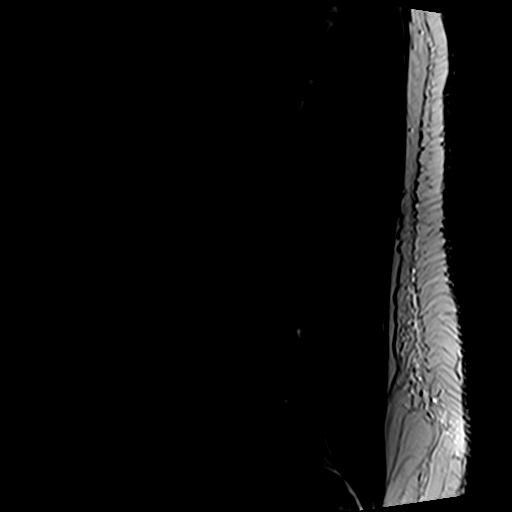
[im 6/15]
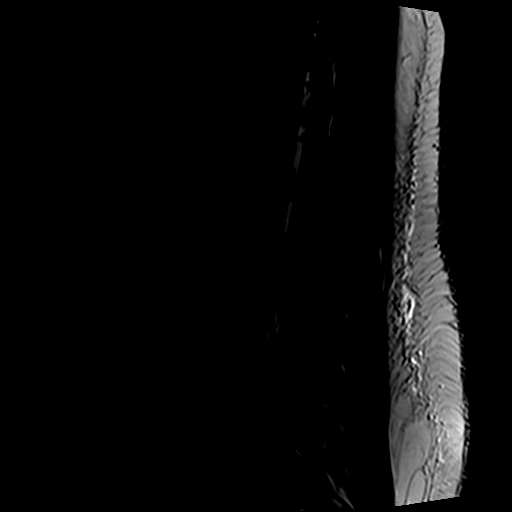
[im 9/15]
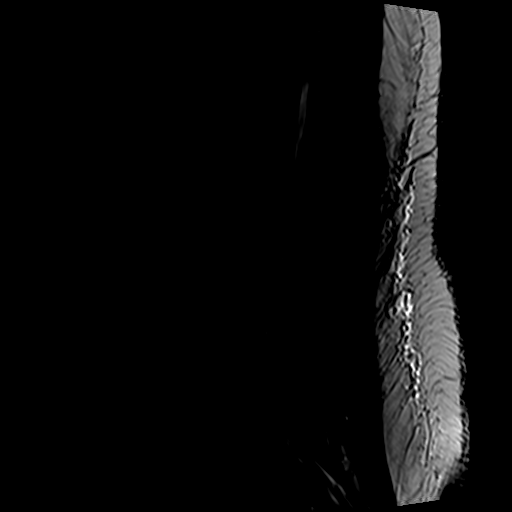
[im 12/15]
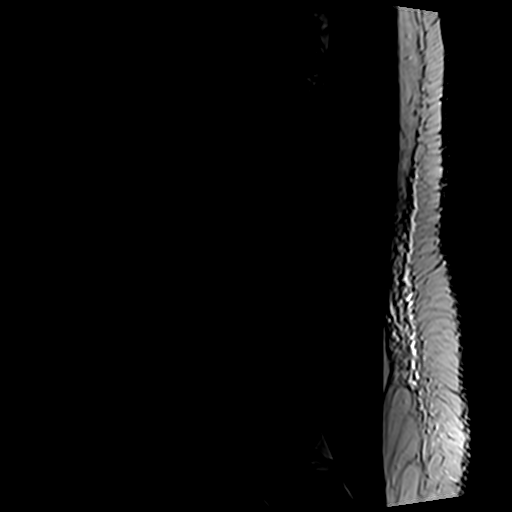
[im 15/15]
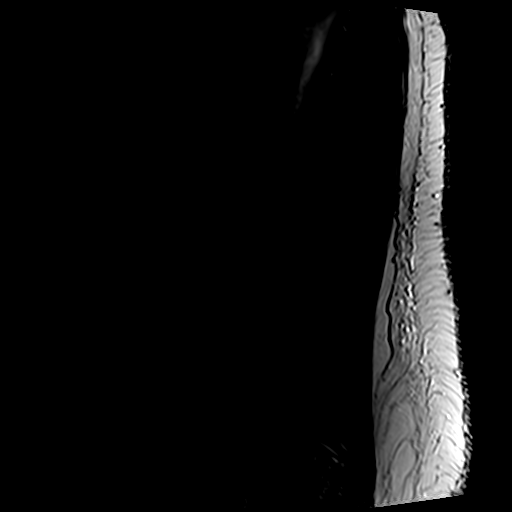

[Series 5: T1 · sagittal · 4.0mm · 0.53mm/px · 6 of 15 slices shown (1 of 2)]
[im 1/15]
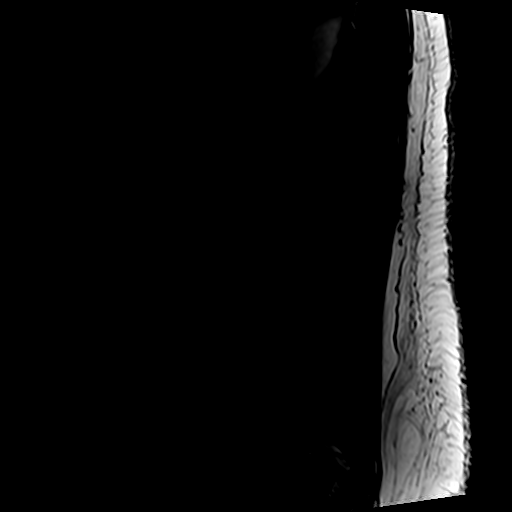
[im 3/15]
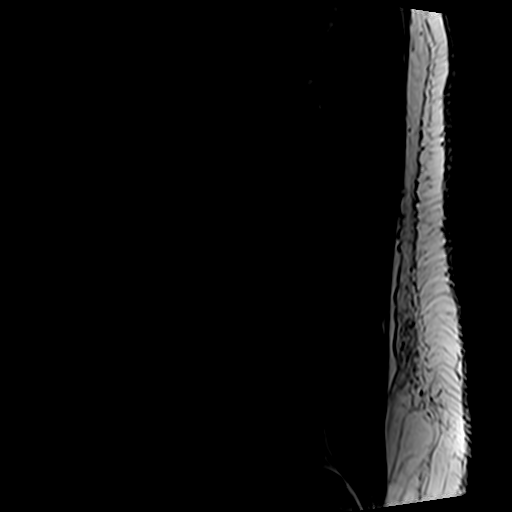
[im 6/15]
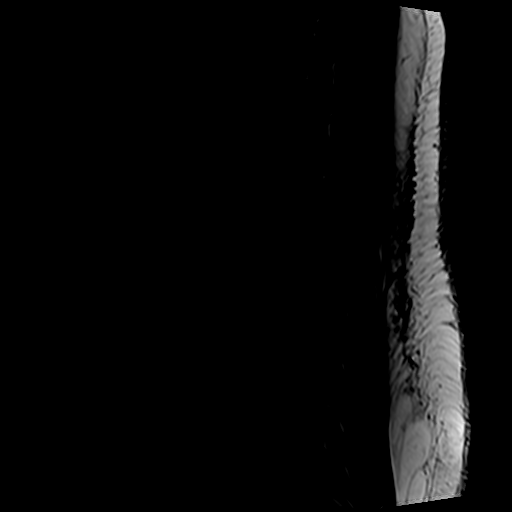
[im 9/15]
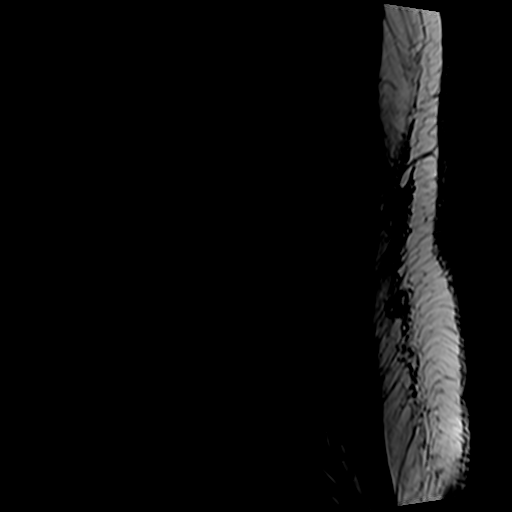
[im 12/15]
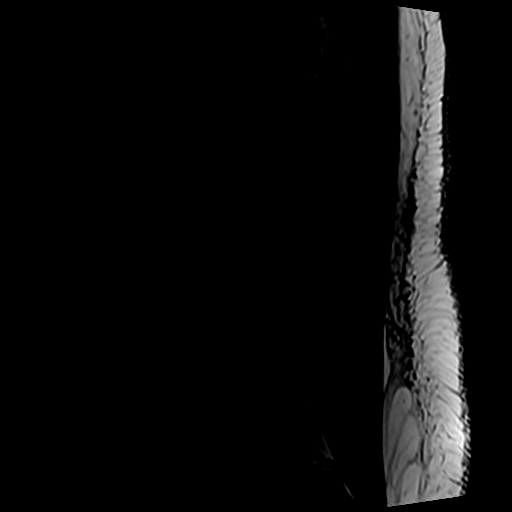
[im 15/15]
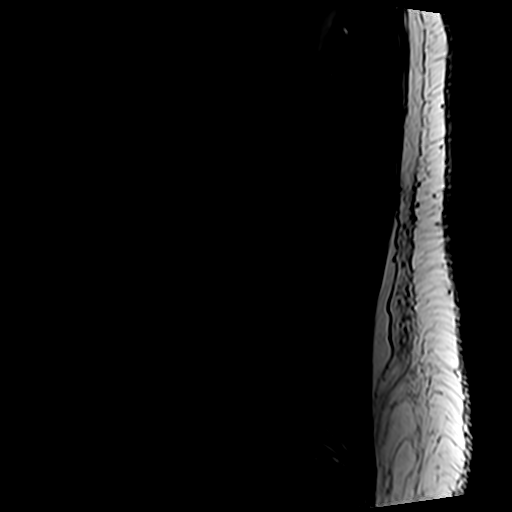

[Series 6: T2 · axial · 4.0mm · 0.70mm/px · z∈[-39,+184]mm · 9 of 38 slices shown (2 of 2)]
[im 1/38]
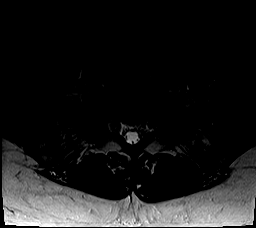
[im 6/38]
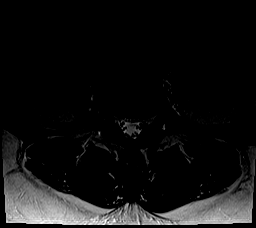
[im 11/38]
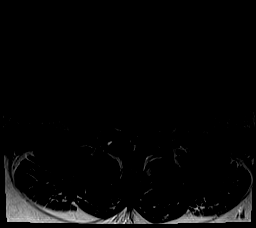
[im 16/38]
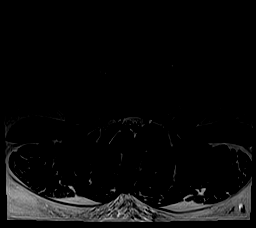
[im 19/38]
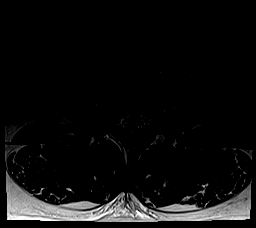
[im 22/38]
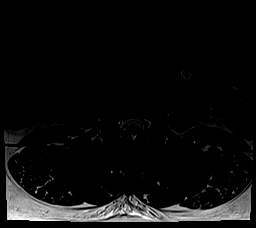
[im 27/38]
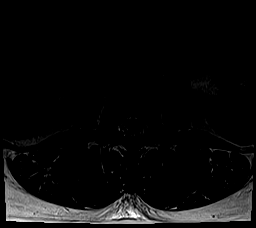
[im 32/38]
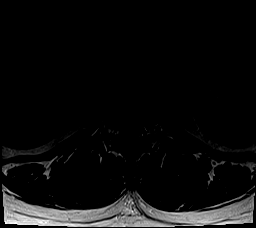
[im 38/38]
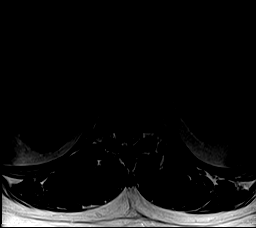

[Series 7: T1 · axial · 4.0mm · 0.35mm/px · z∈[-39,+153]mm · 5 of 38 slices shown (2 of 2)]
[im 1/38]
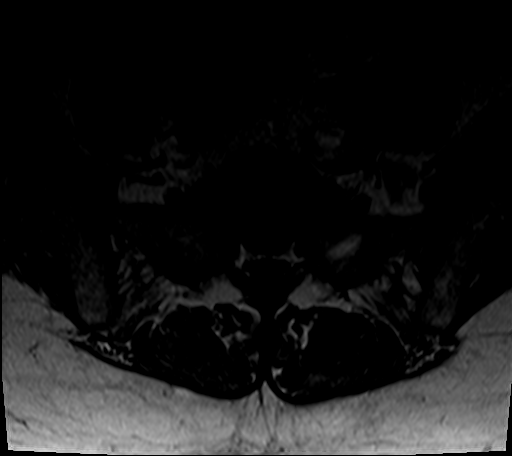
[im 6/38]
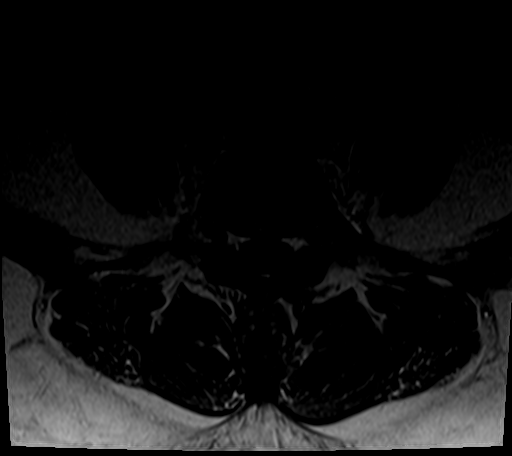
[im 11/38]
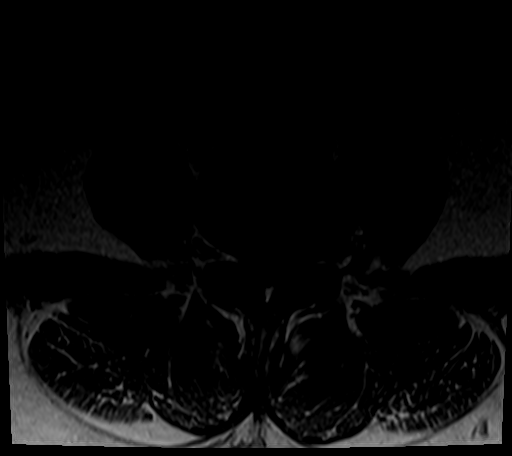
[im 19/38]
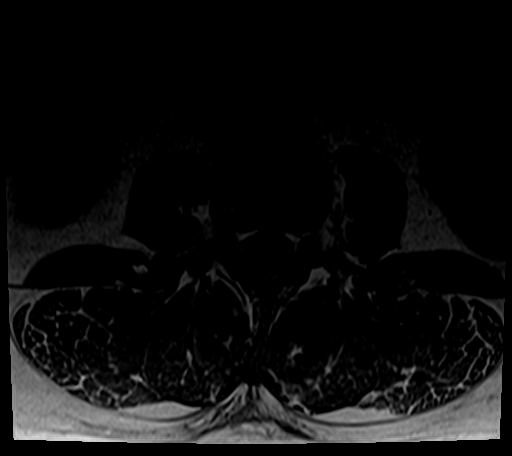
[im 32/38]
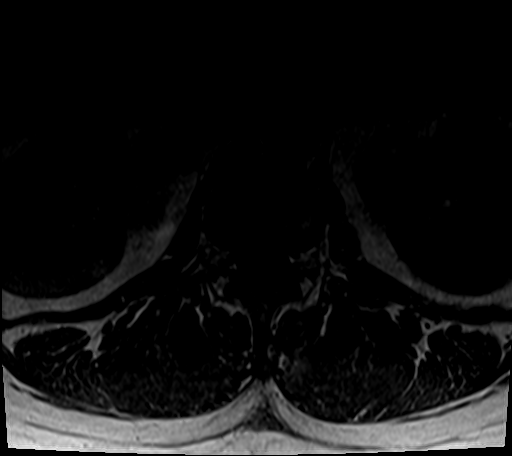

[26 of 48 positions shown; findings below may reference images not displayed]

FINDINGS: Segmentation: 5 non rib-bearing lumbar type vertebral bodies are
present. The lowest fully formed vertebral body is L5.

Alignment: No significant listhesis is present. Straightening of the
normal lumbar lordosis is noted.

Vertebrae:  Marrow signal and vertebral body heights are normal.

Conus medullaris and cauda equina: Conus extends to the L1-2 level.
Conus and cauda equina appear normal.

Paraspinal and other soft tissues: Limited imaging the abdomen is
unremarkable. There is no significant adenopathy. No solid organ
lesions are present.

Disc levels:

L1-2: Negative.

L2-3: Negative.

L3-4: Minimal disc bulging is present. No significant stenosis is
present.

L4-5: A leftward disc protrusion is present. Moderate to severe left
subarticular and foraminal narrowing is present. Mild right
subarticular and foraminal narrowing is present.

L5-S1: Negative.
IMPRESSION: 1. Moderate to severe left subarticular and foraminal stenosis at
L4-5 secondary to a leftward disc protrusion.
2. Mild right subarticular and foraminal narrowing at L4-5.
3. Minimal disc bulging at L3-4 without significant stenosis.

## 2021-01-22 ENCOUNTER — Ambulatory Visit: Payer: BC Managed Care – PPO | Admitting: "Endocrinology

## 2021-02-21 ENCOUNTER — Ambulatory Visit: Payer: BC Managed Care – PPO | Admitting: "Endocrinology

## 2021-03-19 ENCOUNTER — Telehealth: Payer: Self-pay | Admitting: "Endocrinology

## 2021-03-19 ENCOUNTER — Ambulatory Visit: Payer: BC Managed Care – PPO | Admitting: "Endocrinology

## 2021-03-19 NOTE — Telephone Encounter (Signed)
Closed referral on pt. Missed 3 new patient appointments. Can not be rescheduled. Faxed PCP and made aware on 03/19/21

## 2021-05-19 DEATH — deceased
